# Patient Record
Sex: Female | Born: 1965 | Race: Asian | Hispanic: No | Smoking: Never smoker
Health system: Southern US, Community
[De-identification: ages and names within clinical notes are randomized; demographics above are authoritative.]

---

## 1999-01-29 ENCOUNTER — Encounter: Admission: RE | Admit: 1999-01-29 | Discharge: 1999-04-29 | Payer: Self-pay | Admitting: Obstetrics and Gynecology

## 1999-01-30 ENCOUNTER — Inpatient Hospital Stay (HOSPITAL_COMMUNITY): Admission: AD | Admit: 1999-01-30 | Discharge: 1999-01-30 | Payer: Self-pay | Admitting: Obstetrics and Gynecology

## 1999-03-28 ENCOUNTER — Inpatient Hospital Stay (HOSPITAL_COMMUNITY): Admission: AD | Admit: 1999-03-28 | Discharge: 1999-03-30 | Payer: Self-pay | Admitting: Gynecology

## 1999-05-03 ENCOUNTER — Other Ambulatory Visit: Admission: RE | Admit: 1999-05-03 | Discharge: 1999-05-03 | Payer: Self-pay | Admitting: Gynecology

## 2007-07-12 ENCOUNTER — Ambulatory Visit: Payer: Self-pay | Admitting: *Deleted

## 2007-08-31 ENCOUNTER — Encounter: Admission: RE | Admit: 2007-08-31 | Discharge: 2007-08-31 | Payer: Self-pay | Admitting: Family Medicine

## 2008-09-16 ENCOUNTER — Encounter: Admission: RE | Admit: 2008-09-16 | Discharge: 2008-09-16 | Payer: Self-pay | Admitting: Family Medicine

## 2008-12-15 ENCOUNTER — Emergency Department (HOSPITAL_COMMUNITY): Admission: EM | Admit: 2008-12-15 | Discharge: 2008-12-15 | Payer: Self-pay | Admitting: Emergency Medicine

## 2008-12-22 ENCOUNTER — Emergency Department (HOSPITAL_COMMUNITY): Admission: EM | Admit: 2008-12-22 | Discharge: 2008-12-22 | Payer: Self-pay | Admitting: Emergency Medicine

## 2008-12-27 ENCOUNTER — Emergency Department (HOSPITAL_COMMUNITY): Admission: EM | Admit: 2008-12-27 | Discharge: 2008-12-27 | Payer: Self-pay | Admitting: Emergency Medicine

## 2009-01-01 ENCOUNTER — Ambulatory Visit (HOSPITAL_COMMUNITY): Admission: RE | Admit: 2009-01-01 | Discharge: 2009-01-01 | Payer: Self-pay | Admitting: Obstetrics & Gynecology

## 2009-09-30 ENCOUNTER — Encounter: Admission: RE | Admit: 2009-09-30 | Discharge: 2009-09-30 | Payer: Self-pay | Admitting: Family Medicine

## 2010-04-03 ENCOUNTER — Ambulatory Visit: Payer: Self-pay | Admitting: Family

## 2010-04-03 ENCOUNTER — Inpatient Hospital Stay (HOSPITAL_COMMUNITY): Admission: AD | Admit: 2010-04-03 | Discharge: 2010-04-03 | Payer: Self-pay | Admitting: Obstetrics & Gynecology

## 2010-04-05 ENCOUNTER — Inpatient Hospital Stay (HOSPITAL_COMMUNITY): Admission: AD | Admit: 2010-04-05 | Discharge: 2010-04-05 | Payer: Self-pay | Admitting: Family Medicine

## 2010-04-12 ENCOUNTER — Inpatient Hospital Stay (HOSPITAL_COMMUNITY): Admission: AD | Admit: 2010-04-12 | Discharge: 2010-04-12 | Payer: Self-pay | Admitting: Family Medicine

## 2011-01-24 LAB — HCG, QUANTITATIVE, PREGNANCY
hCG, Beta Chain, Quant, S: 13 m[IU]/mL — ABNORMAL HIGH (ref <5)
hCG, Beta Chain, Quant, S: 155 m[IU]/mL — ABNORMAL HIGH (ref <5)

## 2011-01-24 LAB — URINE MICROSCOPIC-ADD ON

## 2011-01-24 LAB — URINALYSIS, ROUTINE W REFLEX MICROSCOPIC
Nitrite: POSITIVE — AB
Specific Gravity, Urine: 1.015 (ref 1.005–1.030)
Urobilinogen, UA: 4 mg/dL — ABNORMAL HIGH (ref 0.0–1.0)
pH: 7.5 (ref 5.0–8.0)

## 2011-01-24 LAB — ABO/RH: ABO/RH(D): B POS

## 2011-01-24 LAB — URINE CULTURE

## 2011-01-24 LAB — CBC
HCT: 35.5 % — ABNORMAL LOW (ref 36.0–46.0)
Hemoglobin: 12.4 g/dL (ref 12.0–15.0)
RBC: 4.09 MIL/uL (ref 3.87–5.11)

## 2011-02-22 LAB — URINALYSIS, ROUTINE W REFLEX MICROSCOPIC
Glucose, UA: NEGATIVE mg/dL
Protein, ur: 100 mg/dL — AB

## 2011-02-22 LAB — URINALYSIS, MICROSCOPIC ONLY
Leukocytes, UA: NEGATIVE
Nitrite: NEGATIVE
Specific Gravity, Urine: 1.023 (ref 1.005–1.030)
Urobilinogen, UA: 0.2 mg/dL (ref 0.0–1.0)
pH: 6.5 (ref 5.0–8.0)

## 2011-02-22 LAB — POCT URINALYSIS DIP (DEVICE)
Bilirubin Urine: NEGATIVE
Glucose, UA: NEGATIVE mg/dL
Glucose, UA: NEGATIVE mg/dL
Ketones, ur: NEGATIVE mg/dL
Nitrite: NEGATIVE
Nitrite: NEGATIVE
Protein, ur: NEGATIVE mg/dL
Specific Gravity, Urine: 1.02 (ref 1.005–1.030)
Specific Gravity, Urine: 1.02 (ref 1.005–1.030)
pH: 6.5 (ref 5.0–8.0)

## 2011-02-22 LAB — URINE CULTURE

## 2011-02-22 LAB — WET PREP, GENITAL
Clue Cells Wet Prep HPF POC: NONE SEEN
WBC, Wet Prep HPF POC: NONE SEEN

## 2011-02-22 LAB — GC/CHLAMYDIA PROBE AMP, GENITAL: Chlamydia, DNA Probe: NEGATIVE

## 2012-08-16 ENCOUNTER — Emergency Department (HOSPITAL_COMMUNITY)
Admission: EM | Admit: 2012-08-16 | Discharge: 2012-08-16 | Disposition: A | Discharge status: Home / Self Care | Payer: Medicaid Other | Source: Home / Self Care | Attending: Emergency Medicine | Admitting: Emergency Medicine

## 2012-08-16 ENCOUNTER — Encounter (HOSPITAL_COMMUNITY): Payer: Self-pay | Admitting: *Deleted

## 2012-08-16 DIAGNOSIS — R200 Anesthesia of skin: Secondary | ICD-10-CM

## 2012-08-16 DIAGNOSIS — R209 Unspecified disturbances of skin sensation: Secondary | ICD-10-CM

## 2012-08-16 DIAGNOSIS — G5603 Carpal tunnel syndrome, bilateral upper limbs: Secondary | ICD-10-CM

## 2012-08-16 MED ORDER — NAPROXEN 375 MG PO TABS: 375.0000 mg | ORAL_TABLET | Freq: Two times a day (BID) | ORAL | Status: AC

## 2012-08-16 NOTE — ED Notes (Signed)
Bilateral   Universal  Wrist  Splints  applied

## 2012-08-16 NOTE — ED Provider Notes (Signed)
History     CSN: [id number]  Arrival date & time 08/16/12  1308   None     Chief Complaint  Patient presents with  . Hand Pain    (Consider location/radiation/quality/duration/timing/severity/associated sxs/prior treatment) The history is provided by the patient.  Bilateral hand tingling for greater than two weeks not associated with injury.  Currently not employed.  Symptoms improved with rest at night.  States noted to be worse when she is in the [employer].  Symptoms have been unchanged since that time.  Denies prior history of related problems.  Has not taken any medication for symptoms.  No change in hand or wrist function.  History reviewed. No pertinent past medical history.  History reviewed. No pertinent past surgical history.  No family history on file.  History  Substance Use Topics  . Smoking status: Never Smoker   . Smokeless tobacco: Not on file  . Alcohol Use: No    OB History    Grav Para Term Preterm Abortions TAB SAB Ect Mult Living                  Review of Systems  Constitutional: Negative.   Respiratory: Negative.   Cardiovascular: Negative.   Musculoskeletal: Negative.   Neurological: Positive for numbness. Negative for weakness.       Tingling    Allergies  Review of patient's allergies indicates no known allergies.  Home Medications   Current Outpatient Rx  Name Route Sig Dispense Refill  . IBUPROFEN 200 MG PO TABS Oral Take 200 mg by mouth every 6 (six) hours as needed.    Marland Kitchen NAPROXEN 375 MG PO TABS Oral Take 1 tablet (375 mg total) by mouth 2 (two) times daily. 60 tablet 2    BP 127/74  Pulse 73  Temp 98.5 F (36.9 C) (Oral)  Resp 12  SpO2 100%  LMP 08/16/2012  Physical Exam  Nursing note and vitals reviewed. Constitutional: She is oriented to person, place, and time. Vital signs are normal. She appears well-developed and well-nourished. She is active and cooperative.  HENT:  Head: Normocephalic.  Eyes: Conjunctivae  normal are normal. Pupils are equal, round, and reactive to light. No scleral icterus.  Neck: Trachea normal. Neck supple.  Cardiovascular: Normal rate and regular rhythm.   Pulmonary/Chest: Effort normal and breath sounds normal.  Musculoskeletal:       Right wrist: Normal.       Left wrist: Normal.       +phalen sign  Neurological: She is alert and oriented to person, place, and time. She has normal strength and normal reflexes. No cranial nerve deficit or sensory deficit. Coordination and gait normal. GCS eye subscore is 4. GCS verbal subscore is 5. GCS motor subscore is 6.  Skin: Skin is warm and dry.  Psychiatric: She has a normal mood and affect. Her speech is normal and behavior is normal. Judgment and thought content normal. Cognition and memory are normal.    ED Course  Procedures (including critical care time)  Labs Reviewed - No data to display No results found.   1. Carpal tunnel syndrome, bilateral   2. Bilateral hand numbness       MDM  Nsaids, wrist brace, follow up with surgeon at your convenience.       Johnsie Kindred, NP 08/21/12 [phone]

## 2012-08-16 NOTE — ED Notes (Signed)
Pt  Reports   Tingling  Sensation  In  Both    Arms  /  Hands          X  2  Weeks    denys  Any  Neck pain       denys  Any  Injury            She  Is  Sitting  Upright on  Exam  Table  Speaking  In  Complete  sentances               She  Appears  In no  Acute  Distress

## 2012-08-27 NOTE — ED Provider Notes (Signed)
Medical screening examination/treatment/procedure(s) were performed by non-physician practitioner and as supervising physician I was immediately available for consultation/collaboration.  Raynald Blend, MD 08/27/12 [phone]

## 2013-02-20 ENCOUNTER — Emergency Department (HOSPITAL_COMMUNITY)
Admission: EM | Admit: 2013-02-20 | Discharge: 2013-02-20 | Disposition: A | Discharge status: Home / Self Care | Payer: Medicaid Other | Source: Home / Self Care

## 2013-02-20 ENCOUNTER — Encounter (HOSPITAL_COMMUNITY): Payer: Self-pay | Admitting: Emergency Medicine

## 2013-02-20 DIAGNOSIS — G5600 Carpal tunnel syndrome, unspecified upper limb: Secondary | ICD-10-CM

## 2013-02-20 DIAGNOSIS — G5603 Carpal tunnel syndrome, bilateral upper limbs: Secondary | ICD-10-CM

## 2013-02-20 MED ORDER — DICLOFENAC POTASSIUM 50 MG PO TABS: 50.0000 mg | ORAL_TABLET | Freq: Three times a day (TID) | ORAL | Status: AC

## 2013-02-20 NOTE — ED Notes (Signed)
Reports both wrists, hands, fingers are numb intermittently, getting worse.  Reports onset approx one year ago.  Denies shoulder or elbow weakness or pain.  Reports specifically in the morning she drops things, unable to grip hands.  Denies neck pain

## 2013-02-20 NOTE — ED Provider Notes (Signed)
History     CSN: [id number]  Arrival date & time 02/20/13  1201   First MD Initiated Contact with Patient 02/20/13 1245      Chief Complaint  Patient presents with  . Hand Pain    (Consider location/radiation/quality/duration/timing/severity/associated sxs/prior treatment) HPI Comments: 47 year old Asian woman complaining of digital numbness and wrist and hand pain laterally for over a year. She was seen here approximately year ago and was treated with medication. She has not followed up with a primary care doctor or salt other medical treatment. She has no out of home shop. 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 She denies any known injury.   History reviewed. No pertinent past medical history.  History reviewed. No pertinent past surgical history.  No family history on file.  History  Substance Use Topics  . Smoking status: Never Smoker   . Smokeless tobacco: Not on file  . Alcohol Use: No    OB History   Grav Para Term Preterm Abortions TAB SAB Ect Mult Living                  Review of Systems  Constitutional: Negative for fever, chills and activity change.  HENT: Negative.   Respiratory: Negative.   Cardiovascular: Negative.   Musculoskeletal:       As per HPI  Skin: Negative for color change, pallor and rash.  Neurological: Positive for weakness and numbness.    Allergies  Review of patient's allergies indicates no known allergies.  Home Medications   Current Outpatient Rx  Name  Route  Sig  Dispense  Refill  . diclofenac (CATAFLAM) 50 MG tablet   Oral   Take 1 tablet (50 mg total) by mouth 3 (three) times daily. As needed for hand pain. Take with food   21 tablet   0   . ibuprofen (ADVIL,MOTRIN) 200 MG tablet   Oral   Take 200 mg by mouth every 6 (six) hours as needed.         . naproxen (NAPROSYN) 375 MG tablet   Oral   Take 1 tablet (375 mg total) by mouth 2  (two) times daily.   60 tablet   2     BP 104/59  Pulse 70  Temp(Src) 97.9 F (36.6 C) (Oral)  Resp 18  SpO2 100%  LMP 02/13/2013  Physical Exam  Nursing note and vitals reviewed. Constitutional: She is oriented to person, place, and time. She appears well-developed and well-nourished. No distress.  HENT:  Head: Normocephalic and atraumatic.  Eyes: EOM are normal.  Neck: Normal range of motion. Neck supple.  Pulmonary/Chest: Effort normal. No respiratory distress.  Musculoskeletal:  Bilateral hands with positive Phalen's sign. Negative Tinel's. Minor tenderness in the digits hand and primarily the wrist. Normal warmth and color. No swelling, deformity or discoloration. Capillary refill less than 2 seconds. Grip strength 4/5 bilaterally. Wrist extension strength 3/5 bilaterally. Radial pulses 2+. Range of motion complete.  Neurological: She is alert and oriented to person, place, and time. No cranial nerve deficit.  Skin: Skin is warm and dry.  Psychiatric: She has a normal mood and affect.    ED Course  Procedures (including critical care time)  Labs Reviewed - No data to display No results found.   1. Bilateral carpal tunnel syndrome       MDM  Splint both left and right wrist the next 2-3 weeks.  Apply ice to the areas of pain of unknown during the next few days. Diclofenac 50 mg one every 8 hours as needed for pain. Take with food Followup with the on-call hand surgeon Dr. Mina Marble, see contact information above. Written information given to patient.         Hayden Rasmussen, NP 02/20/13 1308

## 2013-02-20 NOTE — ED Notes (Signed)
Clarified order for splint: needs bilateral wrist splints per Hayden Rasmussen, np/klh

## 2013-02-21 NOTE — ED Provider Notes (Signed)
Medical screening examination/treatment/procedure(s) were performed by non-physician practitioner and as supervising physician I was immediately available for consultation/collaboration.   MORENO-COLL,Lashae Wollenberg; MD  Norva Bowe Moreno-Coll, MD 02/21/13 0951 

## 2014-12-18 ENCOUNTER — Emergency Department (HOSPITAL_COMMUNITY)
Admission: EM | Admit: 2014-12-18 | Discharge: 2014-12-18 | Disposition: A | Discharge status: Home / Self Care | Payer: Medicaid Other | Attending: Emergency Medicine | Admitting: Emergency Medicine

## 2014-12-18 ENCOUNTER — Encounter (HOSPITAL_COMMUNITY): Payer: Self-pay | Admitting: Nurse Practitioner

## 2014-12-18 DIAGNOSIS — M79604 Pain in right leg: Secondary | ICD-10-CM | POA: Diagnosis not present

## 2014-12-18 DIAGNOSIS — Z791 Long term (current) use of non-steroidal anti-inflammatories (NSAID): Secondary | ICD-10-CM | POA: Diagnosis not present

## 2014-12-18 DIAGNOSIS — Z79899 Other long term (current) drug therapy: Secondary | ICD-10-CM | POA: Insufficient documentation

## 2014-12-18 DIAGNOSIS — M25561 Pain in right knee: Secondary | ICD-10-CM | POA: Diagnosis present

## 2014-12-18 DIAGNOSIS — M1990 Unspecified osteoarthritis, unspecified site: Secondary | ICD-10-CM | POA: Insufficient documentation

## 2014-12-18 DIAGNOSIS — M791 Myalgia: Secondary | ICD-10-CM | POA: Diagnosis not present

## 2014-12-18 MED ORDER — OXYCODONE-ACETAMINOPHEN 5-325 MG PO TABS: ORAL_TABLET | ORAL | Status: AC

## 2014-12-18 MED ORDER — ENOXAPARIN SODIUM 80 MG/0.8ML ~~LOC~~ SOLN
1.0000 mg/kg | Freq: Once | SUBCUTANEOUS | Status: AC
  Administered 2014-12-18: 65 mg via SUBCUTANEOUS
  Filled 2014-12-18: qty 0.8

## 2014-12-18 MED ORDER — OXYCODONE-ACETAMINOPHEN 5-325 MG PO TABS
2.0000 | ORAL_TABLET | Freq: Once | ORAL | Status: AC
Start: 2014-12-18 — End: 2014-12-18
  Administered 2014-12-18: 2 via ORAL
  Filled 2014-12-18: qty 2

## 2014-12-18 NOTE — ED Notes (Signed)
Pt c/o posterior medial deep knee pain, onset a month ago, states she has been seen for the same with possible referral vascular which she states asked her to wait for a few days, states pain has worsened to 10/10, that it makes her "shake." Denies recent long travels, being on birth control, hx of blood clot, shortness of breath or chest pain.

## 2014-12-18 NOTE — ED Provider Notes (Signed)
CSN: [id number]     Arrival date & time 12/18/14  1809 History  This chart was scribed for non-physician practitioner, Wynetta EmeryNicole Kaity Pitstick, PA-C, working with Audree CamelScott T Goldston, MD, by Bronson CurbJacqueline Melvin, ED Scribe. This patient was seen in room WTR6/WTR6 and the patient's care was started at 6:38 PM.   Chief Complaint  Patient presents with  . Knee Pain    Posterior medial of the right knee    The history is provided by the patient. No language interpreter was used.     HPI Comments: Stacie Wall is a 49 y.o. female, with no significant medical history, who presents to the Emergency Department complaining of 7-8/10 right knee pain that radiates down to the right ankle that has been ongoing for approximately 1 month. She denies any falls, injury, or trauma. Patient states she was seen by a physician 3 days ago for the same. She reports the physician recommended a vascular study and was informed to wait for a few days in order to get an appointment. Patient states she is unable to wait due to the unbearable pain. Patient states the pain is a 9/10 at its worse. She has been taking Tylenol without significant improvement. She denies chest pain or SOB.   History reviewed. No pertinent past medical history. History reviewed. No pertinent past surgical history. History reviewed. No pertinent family history. History  Substance Use Topics  . Smoking status: Never Smoker   . Smokeless tobacco: Not on file  . Alcohol Use: No   OB History    No data available     Review of Systems  Constitutional: Negative for fever and chills.  Respiratory: Negative for shortness of breath.   Cardiovascular: Negative for chest pain.  Musculoskeletal: Positive for myalgias and arthralgias.      Allergies  Review of patient's allergies indicates no known allergies.  Home Medications   Prior to Admission medications   Medication Sig Start Date End Date Taking? Authorizing Provider  diclofenac  (CATAFLAM) 50 MG tablet Take 1 tablet (50 mg total) by mouth 3 (three) times daily. As needed for hand pain. Take with food 02/20/13   Hayden Rasmussenavid Mabe, NP  ibuprofen (ADVIL,MOTRIN) 200 MG tablet Take 200 mg by mouth every 6 (six) hours as needed.    Historical Provider, MD  naproxen (NAPROSYN) 375 MG tablet Take 1 tablet (375 mg total) by mouth 2 (two) times daily. 08/16/12   Johnsie Kindredarmen L Chatten, NP   Triage Vitals: BP 137/72 mmHg  Pulse 67  Temp(Src) 97.6 F (36.4 C) (Oral)  Resp 17  SpO2 100%  LMP 12/09/2014 (Exact Date)  Physical Exam  Constitutional: She is oriented to person, place, and time. She appears well-developed and well-nourished. No distress.  HENT:  Head: Normocephalic and atraumatic.  Eyes: Conjunctivae and EOM are normal. Pupils are equal, round, and reactive to light.  Neck: Neck supple. No tracheal deviation present.  Cardiovascular: Normal rate, regular rhythm and intact distal pulses.   Pulmonary/Chest: Effort normal. No respiratory distress.  Musculoskeletal: Normal range of motion.       Legs: No calf asymmetry, distally neurovascularly intact, no superficial collaterals, Homans sign is positive.  Right knee:  No deformity, erythema or abrasions. FROM. No effusion or crepitance. Anterior and posterior drawer show no abnormal laxity. Stable to valgus and varus stress. Joint lines are non-tender. Neurovascularly intact. Pt ambulates with non-antalgic gait.    Neurological: She is alert and oriented to person, place, and time.  Skin: Skin is warm  and dry.  Psychiatric: She has a normal mood and affect. Her behavior is normal.  Nursing note and vitals reviewed.   ED Course  Procedures (including critical care time)  DIAGNOSTIC STUDIES: Oxygen Saturation is 100% on room air, normal by my interpretation.    COORDINATION OF CARE: At 1843 Discussed treatment plan with patient which includes Lovenox injection and referral to vascular. Patient agrees.   Labs  Review Labs Reviewed - No data to display  Imaging Review No results found.   EKG Interpretation None      MDM   Final diagnoses:  Leg pain, inferior, right    Filed Vitals:   12/18/14 1814 12/18/14 1906  BP: 137/72   Pulse: 67   Temp: 97.6 F (36.4 C)   TempSrc: Oral   Resp: 17   Height:   (1.6 m)  Weight:  147 lb (66.679 kg)  SpO2: 100%     Medications  enoxaparin (LOVENOX) injection 65 mg (not administered)  oxyCODONE-acetaminophen (PERCOCET/ROXICET) 5-325 MG per tablet 2 tablet (not administered)    Stacie Wall is a pleasant 49 y.o. female presenting with left posterior knee pain, patient is trying to obtain outpatient DVT study but has had issues, pain is significantly worsened over the last couple of days. She denies chest pain, shortness of breath, hemoptysis or other signs of pulmonary embolism. I'm not able to obtain a vascular study at this time. Patient will be given a dose of Lovenox in the ED and I have arranged for a outpatient vascular duplex tomorrow morning.  Evaluation does not show pathology that would require ongoing emergent intervention or inpatient treatment. Pt is hemodynamically stable and mentating appropriately. Discussed findings and plan with patient/guardian, who agrees with care plan. All questions answered. Return precautions discussed and outpatient follow up given.   New Prescriptions   OXYCODONE-ACETAMINOPHEN (PERCOCET/ROXICET) 5-325 MG PER TABLET    1 to 2 tabs PO q6hrs  PRN for pain     I personally performed the services described in this documentation, which was scribed in my presence. The recorded information has been reviewed and is accurate.   Wynetta Emery, PA-C 12/18/14 1922  Audree Camel, MD 12/22/14 [phone]

## 2014-12-18 NOTE — Discharge Instructions (Signed)
Take percocet for breakthrough pain, do not drink alcohol, drive, care for children or do other critical tasks while taking percocet.  Please follow with your primary care doctor in the next 2 days for a check-up. They must obtain records for further management.   Do not hesitate to return to the Emergency Department for any new, worsening or concerning symptoms.   Musculoskeletal Pain Musculoskeletal pain is muscle and boney aches and pains. These pains can occur in any part of the body. Your caregiver may treat you without knowing the cause of the pain. They may treat you if blood or urine tests, X-rays, and other tests were normal.  CAUSES There is often not a definite cause or reason for these pains. These pains may be caused by a type of germ (virus). The discomfort may also come from overuse. Overuse includes working out too hard when your body is not fit. Boney aches also come from weather changes. Bone is sensitive to atmospheric pressure changes. HOME CARE INSTRUCTIONS   Ask when your test results will be ready. Make sure you get your test results.  Only take over-the-counter or prescription medicines for pain, discomfort, or fever as directed by your caregiver. If you were given medications for your condition, do not drive, operate machinery or power tools, or sign legal documents for 24 hours. Do not drink alcohol. Do not take sleeping pills or other medications that may interfere with treatment.  Continue all activities unless the activities cause more pain. When the pain lessens, slowly resume normal activities. Gradually increase the intensity and duration of the activities or exercise.  During periods of severe pain, bed rest may be helpful. Lay or sit in any position that is comfortable.  Putting ice on the injured area.  Put ice in a bag.  Place a towel between your skin and the bag.  Leave the ice on for 15 to 20 minutes, 3 to 4 times a day.  Follow up with your  caregiver for continued problems and no reason can be found for the pain. If the pain becomes worse or does not go away, it may be necessary to repeat tests or do additional testing. Your caregiver may need to look further for a possible cause. SEEK IMMEDIATE MEDICAL CARE IF:  You have pain that is getting worse and is not relieved by medications.  You develop chest pain that is associated with shortness or breath, sweating, feeling sick to your stomach (nauseous), or throw up (vomit).  Your pain becomes localized to the abdomen.  You develop any new symptoms that seem different or that concern you. MAKE SURE YOU:   Understand these instructions.  Will watch your condition.  Will get help right away if you are not doing well or get worse. Document Released: 10/24/2005 Document Revised: 01/16/2012 Document Reviewed: 06/28/2013 [facility] Patient Information 2015 [place], [place]. This information is not intended to replace advice given to you by your health care provider. Make sure you discuss any questions you have with your health care provider.

## 2014-12-19 ENCOUNTER — Ambulatory Visit (HOSPITAL_COMMUNITY)
Admission: RE | Admit: 2014-12-19 | Discharge: 2014-12-19 | Disposition: A | Discharge status: Home / Self Care | Payer: Medicaid Other | Source: Ambulatory Visit | Attending: Emergency Medicine | Admitting: Emergency Medicine

## 2014-12-19 DIAGNOSIS — M25561 Pain in right knee: Secondary | ICD-10-CM | POA: Insufficient documentation

## 2014-12-19 DIAGNOSIS — M25569 Pain in unspecified knee: Secondary | ICD-10-CM | POA: Diagnosis not present

## 2014-12-19 NOTE — Progress Notes (Signed)
*  PRELIMINARY RESULTS* Vascular Ultrasound Right lower extremity venous duplex has been completed.  Preliminary findings: no evidence of DVT.   Farrel DemarkJill Eunice, RDMS, RVT  12/19/2014, 10:14 AM

## 2014-12-26 ENCOUNTER — Other Ambulatory Visit: Payer: Self-pay | Admitting: *Deleted

## 2014-12-26 DIAGNOSIS — I83811 Varicose veins of right lower extremities with pain: Secondary | ICD-10-CM

## 2015-01-19 ENCOUNTER — Encounter: Payer: Self-pay | Admitting: Vascular Surgery

## 2015-01-20 ENCOUNTER — Encounter: Payer: Self-pay | Admitting: Vascular Surgery

## 2015-01-20 ENCOUNTER — Ambulatory Visit (INDEPENDENT_AMBULATORY_CARE_PROVIDER_SITE_OTHER): Payer: Medicaid Other | Admitting: Vascular Surgery

## 2015-01-20 ENCOUNTER — Ambulatory Visit (HOSPITAL_COMMUNITY)
Admission: RE | Admit: 2015-01-20 | Discharge: 2015-01-20 | Disposition: A | Discharge status: Home / Self Care | Payer: Medicaid Other | Source: Ambulatory Visit | Attending: Vascular Surgery | Admitting: Vascular Surgery

## 2015-01-20 VITALS — BP 120/63 | HR 71 | Resp 16 | Ht 63.0 in | Wt 144.0 lb

## 2015-01-20 DIAGNOSIS — M25561 Pain in right knee: Secondary | ICD-10-CM | POA: Diagnosis not present

## 2015-01-20 DIAGNOSIS — I83811 Varicose veins of right lower extremities with pain: Secondary | ICD-10-CM | POA: Insufficient documentation

## 2015-01-20 NOTE — Progress Notes (Signed)
Subjective:     Patient ID: Stacie Wall, female   DOB: 04/25/1966, 49 y.o.   MRN: [id number]  HPI This 49 year old female was referred for evaluation of possible varicose veins in the right leg. Patient complains primarily of pain in the right knee. His is been going on for several months without a previous injury. She has not been evaluated by an orthopedic surgeon. Movement of the right knee hurts during the daytime and she also has knee pain at night. She occasionally hears a clicking noise in the right knee associated with the pain. She has no history of DVT, thrombophlebitis, stasis ulcers, bleeding, or other vascular problems. She states the knee is unstable at times.  History reviewed. No pertinent past medical history.  History  Substance Use Topics  . Smoking status: Never Smoker   . Smokeless tobacco: Not on file  . Alcohol Use: No    History reviewed. No pertinent family history.  No Known Allergies   Current outpatient prescriptions:  .  diclofenac (CATAFLAM) 50 MG tablet, Take 1 tablet (50 mg total) by mouth 3 (three) times daily. As needed for hand pain. Take with food, Disp: 21 tablet, Rfl: 0 .  ibuprofen (ADVIL,MOTRIN) 200 MG tablet, Take 200 mg by mouth every 6 (six) hours as needed., Disp: , Rfl:  .  naproxen (NAPROSYN) 375 MG tablet, Take 1 tablet (375 mg total) by mouth 2 (two) times daily., Disp: 60 tablet, Rfl: 2 .  oxyCODONE-acetaminophen (PERCOCET/ROXICET) 5-325 MG per tablet, 1 to 2 tabs PO q6hrs  PRN for pain, Disp: 15 tablet, Rfl: 0  BP 120/63 mmHg  Pulse 71  Resp 16  Ht 5\' 3"  (1.6 m)  Wt 144 lb (65.318 kg)  BMI 25.51 kg/m2  Body mass index is 25.51 kg/(m^2).           Review of Systems Denies chest pain, dyspnea on exertion, PND, orthopnea, hemoptsis. All systems negative and complete review of systems     Objective:   Physical Exam BP 120/63 mmHg  Pulse 71  Resp 16  Ht 5\' 3"  (1.6 m)  Wt 144 lb (65.318 kg)  BMI 25.51  kg/m2  Gen.-alert and oriented x3 in no apparent distress  HEENT normal for age Lungs no rhonchi or wheezing Cardiovascular regular rhythm no murmurs carotid pulses 3+ palpable no bruits audible Abdomen soft nontender no palpable masses Musculoskeletal free of  major deformities Skin clear -no rashes Neurologic normal Lower extremities 3+ femoral and dorsalis pedis pulses palpable bilaterally with no edema  right knee is uncomfortable with any flexion or extension. No point tenderness noted. No definite effusion in the right knee joint appreciated. Clicking sensation both knees with flexion. Very small spider vein right lateral distal thigh noted not were present. No hyperpigmentation ulceration or bulging varicosities noted.   Today I ordered a venous duplex exam of the right leg which I reviewed and interpreted. It was a cyst normal study with no DVT or evidence of deep or superficial thrombosis and no reflux in the superficial veins.      Assessment:      right knee pain diet should not vascular in origin-no evidence of arterial or venous insufficiency  Suspect osteoarthritis or possible meniscus problem in right knee     Plan:      patient needs referral to orthopedic surgery. She will contact health department to achieve this

## 2015-01-22 ENCOUNTER — Encounter: Payer: Self-pay | Admitting: Family Medicine

## 2015-07-21 ENCOUNTER — Other Ambulatory Visit: Payer: Self-pay

## 2015-07-21 DIAGNOSIS — Z1231 Encounter for screening mammogram for malignant neoplasm of breast: Secondary | ICD-10-CM

## 2015-07-23 ENCOUNTER — Ambulatory Visit
Admission: RE | Admit: 2015-07-23 | Discharge: 2015-07-23 | Disposition: A | Discharge status: Home / Self Care | Payer: Medicaid Other | Source: Ambulatory Visit

## 2015-07-23 DIAGNOSIS — Z1231 Encounter for screening mammogram for malignant neoplasm of breast: Secondary | ICD-10-CM

## 2015-07-27 ENCOUNTER — Other Ambulatory Visit: Payer: Self-pay | Admitting: Family Medicine

## 2015-07-27 DIAGNOSIS — R928 Other abnormal and inconclusive findings on diagnostic imaging of breast: Secondary | ICD-10-CM

## 2015-08-06 ENCOUNTER — Other Ambulatory Visit: Payer: Self-pay | Admitting: Orthopaedic Surgery

## 2015-08-07 ENCOUNTER — Other Ambulatory Visit: Payer: Self-pay | Admitting: Orthopaedic Surgery

## 2015-08-07 DIAGNOSIS — M25561 Pain in right knee: Secondary | ICD-10-CM

## 2015-08-14 ENCOUNTER — Other Ambulatory Visit: Payer: Self-pay | Admitting: Family Medicine

## 2015-08-18 ENCOUNTER — Other Ambulatory Visit: Payer: Medicaid Other

## 2015-08-20 ENCOUNTER — Ambulatory Visit
Admission: RE | Admit: 2015-08-20 | Discharge: 2015-08-20 | Disposition: A | Discharge status: Home / Self Care | Payer: Medicaid Other | Source: Ambulatory Visit | Attending: Family Medicine | Admitting: Family Medicine

## 2015-08-20 ENCOUNTER — Other Ambulatory Visit: Payer: Self-pay | Admitting: Family Medicine

## 2015-08-20 DIAGNOSIS — R928 Other abnormal and inconclusive findings on diagnostic imaging of breast: Secondary | ICD-10-CM

## 2015-08-25 ENCOUNTER — Other Ambulatory Visit: Payer: Self-pay | Admitting: Family Medicine

## 2015-08-25 ENCOUNTER — Ambulatory Visit
Admission: RE | Admit: 2015-08-25 | Discharge: 2015-08-25 | Disposition: A | Discharge status: Home / Self Care | Payer: Medicaid Other | Source: Ambulatory Visit | Attending: Family Medicine | Admitting: Family Medicine

## 2015-08-25 ENCOUNTER — Ambulatory Visit
Admission: RE | Admit: 2015-08-25 | Discharge: 2015-08-25 | Disposition: A | Discharge status: Home / Self Care | Payer: Medicaid Other | Source: Ambulatory Visit | Attending: Orthopaedic Surgery | Admitting: Orthopaedic Surgery

## 2015-08-25 DIAGNOSIS — M25561 Pain in right knee: Secondary | ICD-10-CM

## 2015-08-25 DIAGNOSIS — R928 Other abnormal and inconclusive findings on diagnostic imaging of breast: Secondary | ICD-10-CM

## 2016-04-10 IMAGING — MG MM DIAG BREAST TOMO UNI LEFT
12 of 17 series · 12 of 40 positions shown · non-contrast
Comparison: Previous exam(s).

CLINICAL DATA: Patient is an asymptomatic 49-year-old female who
was recalled from screening mammography to further evaluate the left
breast.

EXAM:
DIGITAL DIAGNOSTIC LEFT MAMMOGRAM WITH 3D TOMOSYNTHESIS WITH CAD
ULTRASOUND LEFT BREAST

[L CC (1 of 2)]
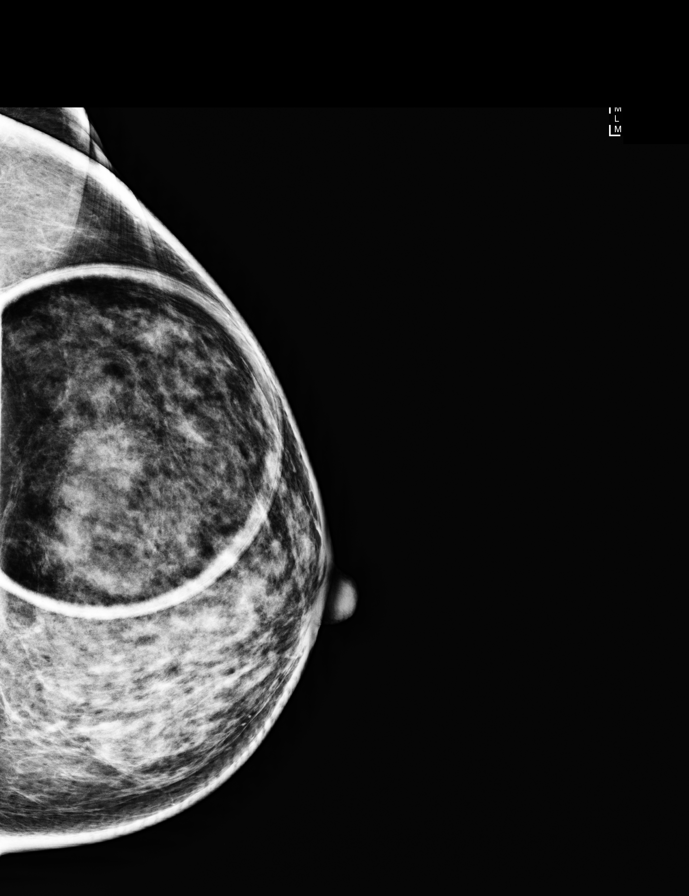

[L CC (2 of 2)]
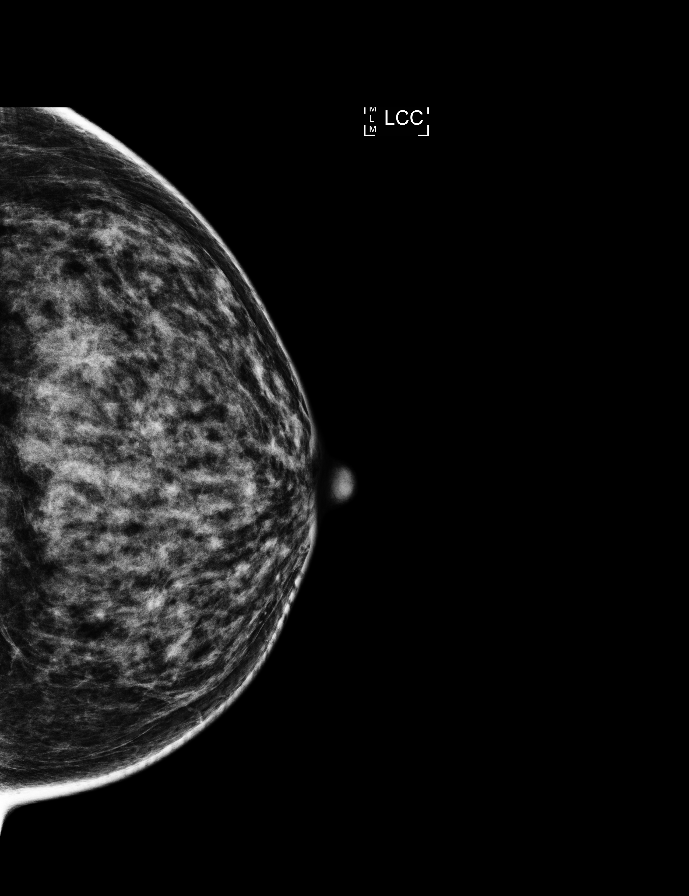

[L ML (1 of 2)]
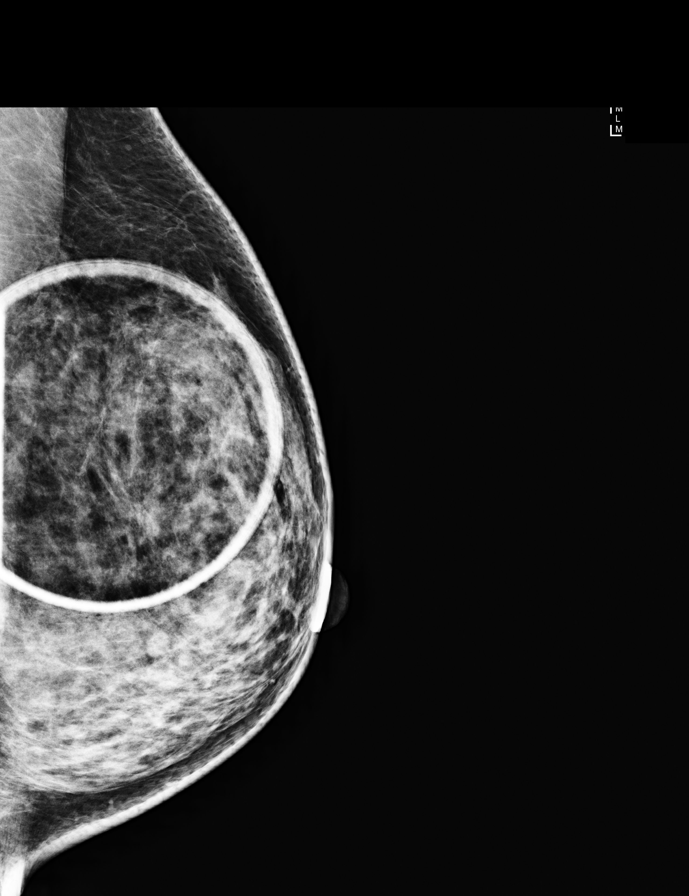

[L ML (2 of 2)]
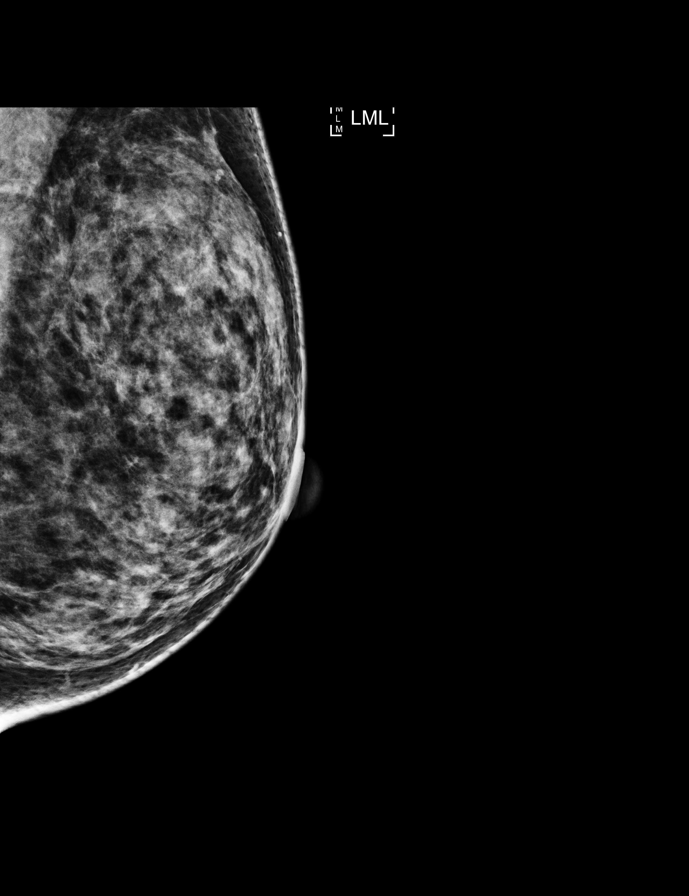

[L MLO (1 of 2)]
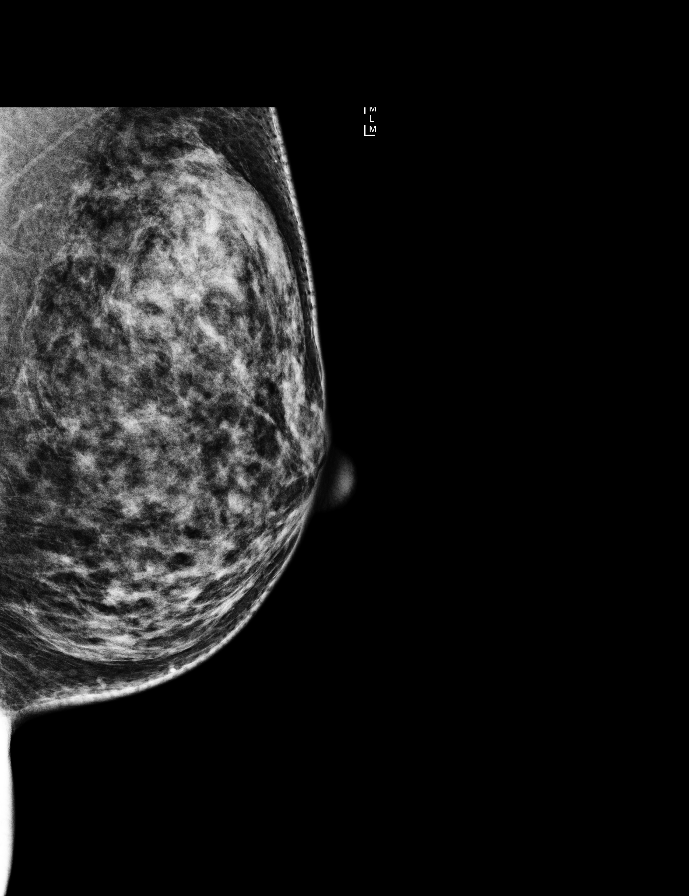

[L MLO (2 of 2)]
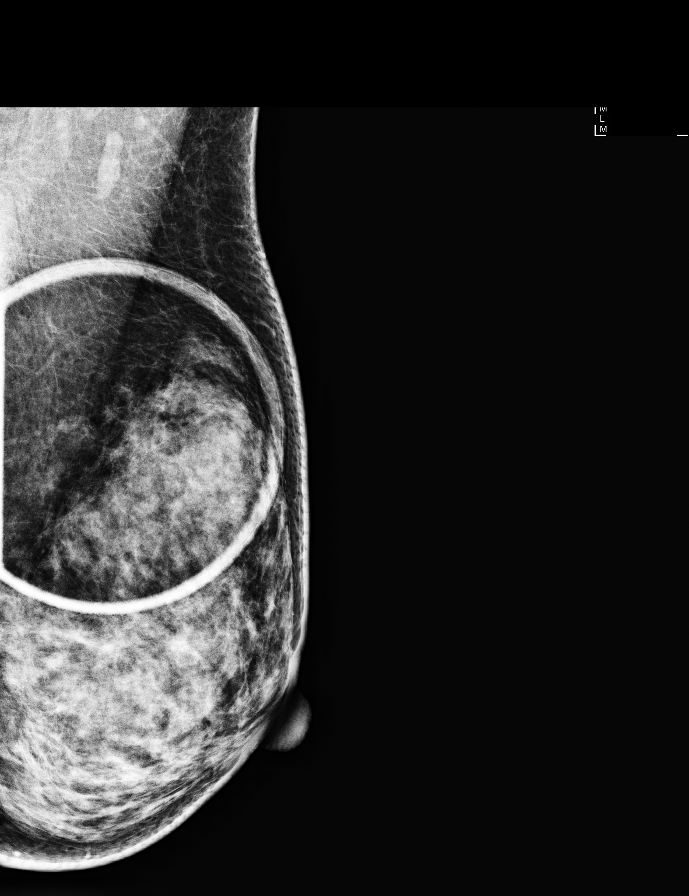

[L CC tomo (1 of 3)]
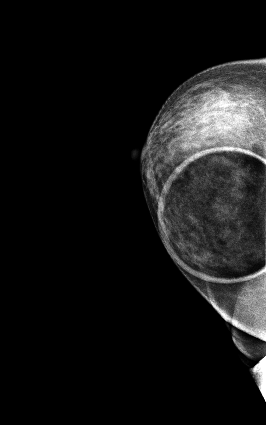

[L MLO tomo (1 of 2)]
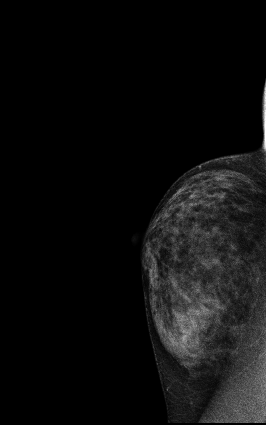

[L MLO tomo (2 of 2)]
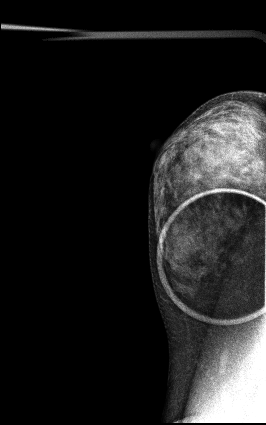

[L ML tomo]
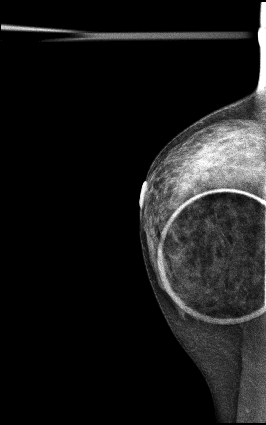

[L CC tomo (2 of 3)]
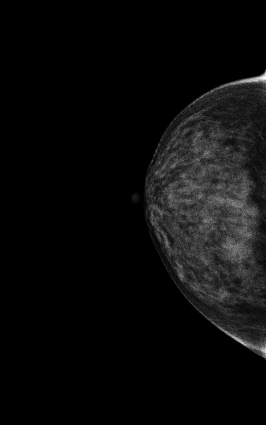

[L CC tomo (3 of 3) · tomo slice 27/52.0]
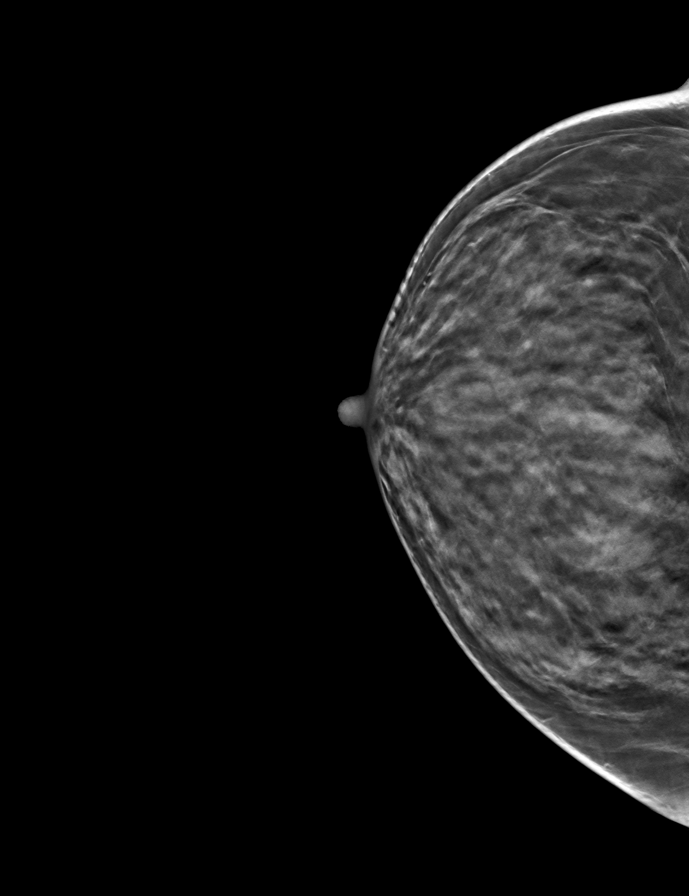

[12 of 40 positions shown; findings below may reference images not displayed]

ACR Breast Density Category d: The breast tissue is extremely dense,
which lowers the sensitivity of mammography.
FINDINGS: Left unilateral digital diagnostic mammography demonstrates
extremely dense parenchymal pattern which may obscure detection of
small masses. Spot-compression view with 3D tomosynthesis in the
mediolateral mammographic view confirms the presence of focal
architectural distortion in the upper outer quadrant of the left
breast. This is less well seen on the CC projection. No suspicious
microcalcifications.

Mammographic images were processed with CAD.

On physical exam, no palpable masses are noted.

Targeted ultrasound is performed in the upper-outer quadrant. This
demonstrates extremely dense glandular tissue without conspicuous
cystic or solid mass or evidence of architectural distortion.
IMPRESSION: Architectural distortion in the upper outer left breast is
suspicious.

RECOMMENDATION:
Recommend stereotactic guided core needle biopsy with 3D
tomosynthesis.

I have discussed the findings and recommendations with the patient.
Results were also provided in writing at the conclusion of the
visit. If applicable, a reminder letter will be sent to the patient
regarding the next appointment.

BI-RADS CATEGORY  4: Suspicious.

## 2016-10-19 ENCOUNTER — Encounter (HOSPITAL_COMMUNITY): Payer: Self-pay | Admitting: Emergency Medicine

## 2016-10-19 ENCOUNTER — Emergency Department (HOSPITAL_COMMUNITY)
Admission: EM | Admit: 2016-10-19 | Discharge: 2016-10-19 | Disposition: A | Discharge status: Home / Self Care | Payer: Medicaid Other | Attending: Emergency Medicine | Admitting: Emergency Medicine

## 2016-10-19 DIAGNOSIS — Z79899 Other long term (current) drug therapy: Secondary | ICD-10-CM | POA: Diagnosis not present

## 2016-10-19 DIAGNOSIS — R112 Nausea with vomiting, unspecified: Secondary | ICD-10-CM | POA: Diagnosis not present

## 2016-10-19 DIAGNOSIS — R1013 Epigastric pain: Secondary | ICD-10-CM | POA: Diagnosis present

## 2016-10-19 DIAGNOSIS — R197 Diarrhea, unspecified: Secondary | ICD-10-CM | POA: Diagnosis not present

## 2016-10-19 LAB — COMPREHENSIVE METABOLIC PANEL
ALK PHOS: 57 U/L (ref 38–126)
ALT: 21 U/L (ref 14–54)
AST: 20 U/L (ref 15–41)
Albumin: 4.4 g/dL (ref 3.5–5.0)
Anion gap: 8 (ref 5–15)
BUN: 15 mg/dL (ref 6–20)
CALCIUM: 8.9 mg/dL (ref 8.9–10.3)
CO2: 27 mmol/L (ref 22–32)
CREATININE: 0.64 mg/dL (ref 0.44–1.00)
Chloride: 101 mmol/L (ref 101–111)
GFR calc Af Amer: >60 mL/min (ref >60)
Glucose, Bld: 89 mg/dL (ref 65–99)
Potassium: 3.8 mmol/L (ref 3.5–5.1)
SODIUM: 136 mmol/L (ref 135–145)
Total Bilirubin: 0.7 mg/dL (ref 0.3–1.2)
Total Protein: 7.6 g/dL (ref 6.5–8.1)

## 2016-10-19 LAB — URINALYSIS, ROUTINE W REFLEX MICROSCOPIC
Bilirubin Urine: NEGATIVE
GLUCOSE, UA: NEGATIVE mg/dL
KETONES UR: NEGATIVE mg/dL
Nitrite: NEGATIVE
PH: 6 (ref 5.0–8.0)
Protein, ur: NEGATIVE mg/dL
SPECIFIC GRAVITY, URINE: 1.01 (ref 1.005–1.030)

## 2016-10-19 LAB — CBC
HCT: 41 % (ref 36.0–46.0)
Hemoglobin: 14.4 g/dL (ref 12.0–15.0)
MCH: 29.1 pg (ref 26.0–34.0)
MCHC: 35.1 g/dL (ref 30.0–36.0)
MCV: 82.8 fL (ref 78.0–100.0)
PLATELETS: 201 10*3/uL (ref 150–400)
RBC: 4.95 MIL/uL (ref 3.87–5.11)
RDW: 12.2 % (ref 11.5–15.5)
WBC: 13.6 10*3/uL — ABNORMAL HIGH (ref 4.0–10.5)

## 2016-10-19 LAB — LIPASE, BLOOD: Lipase: 30 U/L (ref 11–51)

## 2016-10-19 LAB — I-STAT BETA HCG BLOOD, ED (MC, WL, AP ONLY): I-stat hCG, quantitative: <5 m[IU]/mL (ref <5)

## 2016-10-19 MED ORDER — SODIUM CHLORIDE 0.9 % IV BOLUS (SEPSIS)
1000.0000 mL | Freq: Once | INTRAVENOUS | Status: AC
  Administered 2016-10-19: 1000 mL via INTRAVENOUS

## 2016-10-19 MED ORDER — ONDANSETRON HCL 4 MG/2ML IJ SOLN
4.0000 mg | Freq: Once | INTRAMUSCULAR | Status: AC
  Administered 2016-10-19: 4 mg via INTRAVENOUS
  Filled 2016-10-19: qty 2

## 2016-10-19 MED ORDER — ONDANSETRON 4 MG PO TBDP: 4.0000 mg | ORAL_TABLET | Freq: Four times a day (QID) | ORAL | 0 refills | Status: AC | PRN

## 2016-10-19 MED ORDER — GI COCKTAIL ~~LOC~~
30.0000 mL | Freq: Once | ORAL | Status: AC
  Administered 2016-10-19: 30 mL via ORAL
  Filled 2016-10-19: qty 30

## 2016-10-19 MED ORDER — FAMOTIDINE IN NACL 20-0.9 MG/50ML-% IV SOLN
20.0000 mg | Freq: Once | INTRAVENOUS | Status: AC
  Administered 2016-10-19: 20 mg via INTRAVENOUS
  Filled 2016-10-19: qty 50

## 2016-10-19 NOTE — ED Triage Notes (Signed)
Pt c/o abdominal pain that began last night; has had diarrhea for past three days; vomiting began this morning as well; denies fever and and urinary symptoms

## 2016-10-19 NOTE — ED Provider Notes (Signed)
[facility]-EMERGENCY DEPT Provider Note   CSN: [id number] Arrival date & time: 10/19/16  0557     History   Chief Complaint Chief Complaint  Patient presents with  . Abdominal Pain    HPI Francesco RunnerSambo Dorer is a 50 y.o. female.  The history is provided by the patient. No language interpreter was used.   Francesco RunnerSambo Isley is a 50 y.o. female who presents to the Emergency Department complaining of abdominal pain.  She reports 3 days of significant diarrhea, followed by development of epigastric pain and vomiting yesterday and last night.  She reports 6 episodes of nonbloody emesis yesterday.  No fevers.  She has no sick contacts and nobody food exposures, no recent travel.  She does not currently work.  Symptoms are moderate, constant, worsening.  She denies history of prior medical illness or prior surgery.  History reviewed. No pertinent past medical history.  Patient Active Problem List   Diagnosis Date Noted  . Right knee pain 01/20/2015    History reviewed. No pertinent surgical history.  OB History    No data available       Home Medications    Prior to Admission medications   Medication Sig Start Date End Date Taking? Authorizing Provider  diclofenac (CATAFLAM) 50 MG tablet Take 1 tablet (50 mg total) by mouth 3 (three) times daily. As needed for hand pain. Take with food 02/20/13   Hayden Rasmussenavid Mabe, NP  ibuprofen (ADVIL,MOTRIN) 200 MG tablet Take 200 mg by mouth every 6 (six) hours as needed.    Historical Provider, MD  naproxen (NAPROSYN) 375 MG tablet Take 1 tablet (375 mg total) by mouth 2 (two) times daily. 08/16/12   Johnsie Kindredarmen L Chatten, NP  ondansetron (ZOFRAN ODT) 4 MG disintegrating tablet Take 1 tablet (4 mg total) by mouth every 6 (six) hours as needed for nausea or vomiting. 10/19/16   Tilden FossaElizabeth Casten Floren, MD  oxyCODONE-acetaminophen (PERCOCET/ROXICET) 5-325 MG per tablet 1 to 2 tabs PO q6hrs  PRN for pain 12/18/14   Wynetta EmeryNicole Pisciotta, PA-C    Family History No family  history on file.  Social History Social History  Substance Use Topics  . Smoking status: Never Smoker  . Smokeless tobacco: Never Used  . Alcohol use No     Allergies   Patient has no known allergies.   Review of Systems Review of Systems  All other systems reviewed and are negative.    Physical Exam Updated Vital Signs BP 98/67 (BP Location: Right Arm)   Pulse 74   Temp 97.6 F (36.4 C) (Oral)   Resp 15   LMP 09/26/2016   SpO2 96%   Physical Exam  Constitutional: She is oriented to person, place, and time. She appears well-developed and well-nourished.  HENT:  Head: Normocephalic and atraumatic.  Cardiovascular: Normal rate and regular rhythm.   No murmur heard. Pulmonary/Chest: Effort normal and breath sounds normal. No respiratory distress.  Abdominal: Soft. There is no rebound and no guarding.  Mild epigastric tenderness  Musculoskeletal: She exhibits no edema or tenderness.  Neurological: She is alert and oriented to person, place, and time.  Skin: Skin is warm and dry.  Psychiatric: She has a normal mood and affect. Her behavior is normal.  Nursing note and vitals reviewed.    ED Treatments / Results  Labs (all labs ordered are listed, but only abnormal results are displayed) Labs Reviewed  CBC - Abnormal; Notable for the following:       Result Value   WBC  13.6 (*)    All other components within normal limits  URINALYSIS, ROUTINE W REFLEX MICROSCOPIC - Abnormal; Notable for the following:    Color, Urine STRAW (*)    Hgb urine dipstick LARGE (*)    Leukocytes, UA TRACE (*)    Bacteria, UA RARE (*)    Squamous Epithelial / LPF 0-5 (*)    All other components within normal limits  LIPASE, BLOOD  COMPREHENSIVE METABOLIC PANEL  I-STAT BETA HCG BLOOD, ED ([facility], [facility], [facility] ONLY)    EKG  EKG Interpretation None       Radiology No results found.  Procedures Procedures (including critical care time)  Medications Ordered in ED Medications    famotidine (PEPCID) IVPB 20 mg premix (20 mg Intravenous New Bag/Given 10/19/16 0756)  sodium chloride 0.9 % bolus 1,000 mL (1,000 mLs Intravenous New Bag/Given 10/19/16 0721)  ondansetron (ZOFRAN) injection 4 mg (4 mg Intravenous Given 10/19/16 0721)  gi cocktail (Maalox,Lidocaine,Donnatal) (30 mLs Oral Given 10/19/16 [id number])     Initial Impression / Assessment and Plan / ED Course  I have reviewed the triage vital signs and the nursing notes.  Pertinent labs & imaging results that were available during my care of the patient were reviewed by me and considered in my medical decision making (see chart for details).  Clinical Course as of Oct 19 822  Wed Oct 19, 2016  [id number] Pt's epigastric pain partially improved with GI cocktail.  UA with RBCs -pt currently w/ menses, denies dysuria.   [ER]    Clinical Course User Index [ER] Tilden FossaElizabeth Jaemarie Hochberg, MD    Patient here with nausea, vomiting, diarrhea, epigastric pain.  On repeat assessment after treatment in the emergency department.  Her epigastric pain is resolved and she is tolerating oral fluids without difficulty.  Clinical picture is consistent with viral gastroenteritis.  No evidence of cholecystitis, pancreatitis, appendicitis, UTI.  Discussed home care with oral fluid hydration, antiemetics, PCP follow-up, return precautions.  Final Clinical Impressions(s) / ED Diagnoses   Final diagnoses:  Nausea vomiting and diarrhea    New Prescriptions New Prescriptions   ONDANSETRON (ZOFRAN ODT) 4 MG DISINTEGRATING TABLET    Take 1 tablet (4 mg total) by mouth every 6 (six) hours as needed for nausea or vomiting.     Tilden FossaElizabeth Tiberius Loftus, MD 10/19/16 [phone]

## 2021-04-20 ENCOUNTER — Emergency Department (HOSPITAL_COMMUNITY): Payer: No Typology Code available for payment source

## 2021-04-20 ENCOUNTER — Encounter (HOSPITAL_COMMUNITY): Payer: Self-pay

## 2021-04-20 ENCOUNTER — Emergency Department (HOSPITAL_COMMUNITY)
Admission: EM | Admit: 2021-04-20 | Discharge: 2021-04-21 | Disposition: A | Discharge status: Home / Self Care | Payer: No Typology Code available for payment source | Attending: Emergency Medicine | Admitting: Emergency Medicine

## 2021-04-20 ENCOUNTER — Other Ambulatory Visit: Payer: Self-pay

## 2021-04-20 DIAGNOSIS — R52 Pain, unspecified: Secondary | ICD-10-CM

## 2021-04-20 DIAGNOSIS — R935 Abnormal findings on diagnostic imaging of other abdominal regions, including retroperitoneum: Secondary | ICD-10-CM

## 2021-04-20 DIAGNOSIS — R079 Chest pain, unspecified: Secondary | ICD-10-CM

## 2021-04-20 DIAGNOSIS — S20212A Contusion of left front wall of thorax, initial encounter: Secondary | ICD-10-CM | POA: Insufficient documentation

## 2021-04-20 DIAGNOSIS — R55 Syncope and collapse: Secondary | ICD-10-CM | POA: Insufficient documentation

## 2021-04-20 DIAGNOSIS — M542 Cervicalgia: Secondary | ICD-10-CM | POA: Diagnosis not present

## 2021-04-20 DIAGNOSIS — Y92410 Unspecified street and highway as the place of occurrence of the external cause: Secondary | ICD-10-CM | POA: Diagnosis not present

## 2021-04-20 DIAGNOSIS — R1010 Upper abdominal pain, unspecified: Secondary | ICD-10-CM | POA: Insufficient documentation

## 2021-04-20 DIAGNOSIS — S40012A Contusion of left shoulder, initial encounter: Secondary | ICD-10-CM | POA: Insufficient documentation

## 2021-04-20 DIAGNOSIS — M25571 Pain in right ankle and joints of right foot: Secondary | ICD-10-CM

## 2021-04-20 DIAGNOSIS — R0602 Shortness of breath: Secondary | ICD-10-CM | POA: Diagnosis not present

## 2021-04-20 DIAGNOSIS — S299XXA Unspecified injury of thorax, initial encounter: Secondary | ICD-10-CM | POA: Diagnosis present
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
## 2021-04-21 ENCOUNTER — Emergency Department (HOSPITAL_COMMUNITY): Payer: No Typology Code available for payment source

## 2021-04-21 LAB — I-STAT BETA HCG BLOOD, ED (MC, WL, AP ONLY): I-stat hCG, quantitative: <5 m[IU]/mL (ref <5)

## 2021-04-21 LAB — BASIC METABOLIC PANEL
Anion gap: 10 (ref 5–15)
BUN: 15 mg/dL (ref 6–20)
CO2: 26 mmol/L (ref 22–32)
Calcium: 9.5 mg/dL (ref 8.9–10.3)
Chloride: 99 mmol/L (ref 98–111)
Creatinine, Ser: 0.61 mg/dL (ref 0.44–1.00)
GFR, Estimated: >60 mL/min (ref >60)
Glucose, Bld: 214 mg/dL — ABNORMAL HIGH (ref 70–99)
Potassium: 4.2 mmol/L (ref 3.5–5.1)
Sodium: 135 mmol/L (ref 135–145)

## 2021-04-21 LAB — CBC
HCT: 41.3 % (ref 36.0–46.0)
Hemoglobin: 14.1 g/dL (ref 12.0–15.0)
MCH: 28.8 pg (ref 26.0–34.0)
MCHC: 34.1 g/dL (ref 30.0–36.0)
MCV: 84.3 fL (ref 80.0–100.0)
Platelets: 189 10*3/uL (ref 150–400)
RBC: 4.9 MIL/uL (ref 3.87–5.11)
RDW: 12 % (ref 11.5–15.5)
WBC: 8.1 10*3/uL (ref 4.0–10.5)
nRBC: 0 % (ref 0.0–0.2)

## 2021-04-21 MED ORDER — CYCLOBENZAPRINE HCL 10 MG PO TABS: 10.0000 mg | ORAL_TABLET | Freq: Two times a day (BID) | ORAL | 0 refills | Status: AC | PRN

## 2021-04-21 MED ORDER — IBUPROFEN 600 MG PO TABS: 600.0000 mg | ORAL_TABLET | Freq: Four times a day (QID) | ORAL | 0 refills | Status: AC | PRN

## 2021-04-21 MED ORDER — ONDANSETRON HCL 4 MG/2ML IJ SOLN
4.0000 mg | Freq: Once | INTRAMUSCULAR | Status: AC
  Administered 2021-04-21: 07:00:00 4 mg via INTRAVENOUS
  Filled 2021-04-21: qty 2

## 2021-04-21 MED ORDER — IOHEXOL 350 MG/ML SOLN
100.0000 mL | Freq: Once | INTRAVENOUS | Status: AC | PRN
  Administered 2021-04-21: 09:00:00 100 mL via INTRAVENOUS

## 2021-04-21 MED ORDER — MORPHINE SULFATE (PF) 4 MG/ML IV SOLN
4.0000 mg | Freq: Once | INTRAVENOUS | Status: AC
  Administered 2021-04-21: 07:00:00 4 mg via INTRAVENOUS
  Filled 2021-04-21: qty 1

## 2021-04-21 NOTE — ED Notes (Signed)
ekg shown to doctor 
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
## 2021-04-21 NOTE — ED Notes (Signed)
Patient transported to CT
